# Patient Record
Sex: Female | Born: 1935 | Race: White | Hispanic: No | Marital: Single | Smoking: Never smoker
Health system: Southern US, Community
[De-identification: ages and names within clinical notes are randomized; demographics above are authoritative.]

## PROBLEM LIST (undated history)

## (undated) DIAGNOSIS — R569 Unspecified convulsions: Secondary | ICD-10-CM

## (undated) DIAGNOSIS — G3185 Corticobasal degeneration: Secondary | ICD-10-CM

## (undated) HISTORY — PX: BACK SURGERY: SHX140

## (undated) HISTORY — PX: ABDOMINAL HYSTERECTOMY: SHX81

---

## 2009-02-23 ENCOUNTER — Emergency Department: Payer: Self-pay | Admitting: Unknown Physician Specialty

## 2011-11-12 DIAGNOSIS — G119 Hereditary ataxia, unspecified: Secondary | ICD-10-CM | POA: Insufficient documentation

## 2011-11-12 DIAGNOSIS — G3185 Corticobasal degeneration: Secondary | ICD-10-CM | POA: Insufficient documentation

## 2011-12-16 DIAGNOSIS — Z981 Arthrodesis status: Secondary | ICD-10-CM | POA: Insufficient documentation

## 2012-04-02 DIAGNOSIS — J45909 Unspecified asthma, uncomplicated: Secondary | ICD-10-CM | POA: Insufficient documentation

## 2012-04-02 DIAGNOSIS — C55 Malignant neoplasm of uterus, part unspecified: Secondary | ICD-10-CM | POA: Insufficient documentation

## 2012-04-02 DIAGNOSIS — D649 Anemia, unspecified: Secondary | ICD-10-CM | POA: Insufficient documentation

## 2012-04-02 DIAGNOSIS — IMO0002 Reserved for concepts with insufficient information to code with codable children: Secondary | ICD-10-CM | POA: Insufficient documentation

## 2012-04-02 DIAGNOSIS — E785 Hyperlipidemia, unspecified: Secondary | ICD-10-CM | POA: Insufficient documentation

## 2012-04-02 DIAGNOSIS — R569 Unspecified convulsions: Secondary | ICD-10-CM | POA: Insufficient documentation

## 2012-04-02 DIAGNOSIS — Z79899 Other long term (current) drug therapy: Secondary | ICD-10-CM | POA: Insufficient documentation

## 2012-07-29 DIAGNOSIS — H4010X0 Unspecified open-angle glaucoma, stage unspecified: Secondary | ICD-10-CM | POA: Insufficient documentation

## 2012-08-24 DIAGNOSIS — G4733 Obstructive sleep apnea (adult) (pediatric): Secondary | ICD-10-CM | POA: Insufficient documentation

## 2013-09-16 DIAGNOSIS — K449 Diaphragmatic hernia without obstruction or gangrene: Secondary | ICD-10-CM | POA: Insufficient documentation

## 2015-06-25 ENCOUNTER — Other Ambulatory Visit: Payer: Self-pay | Admitting: Neurology

## 2015-06-25 DIAGNOSIS — G3185 Corticobasal degeneration: Secondary | ICD-10-CM

## 2015-07-03 ENCOUNTER — Ambulatory Visit
Admission: RE | Admit: 2015-07-03 | Discharge: 2015-07-03 | Disposition: A | Discharge status: Home / Self Care | Payer: Medicare Other | Source: Ambulatory Visit | Attending: Neurology | Admitting: Neurology

## 2015-07-03 DIAGNOSIS — G118 Other hereditary ataxias: Secondary | ICD-10-CM | POA: Insufficient documentation

## 2015-07-03 DIAGNOSIS — G3185 Corticobasal degeneration: Secondary | ICD-10-CM

## 2015-07-03 DIAGNOSIS — G249 Dystonia, unspecified: Secondary | ICD-10-CM | POA: Diagnosis present

## 2015-10-30 ENCOUNTER — Emergency Department: Payer: Medicare Other

## 2015-10-30 ENCOUNTER — Encounter: Payer: Self-pay | Admitting: Emergency Medicine

## 2015-10-30 ENCOUNTER — Emergency Department
Admission: EM | Admit: 2015-10-30 | Discharge: 2015-10-30 | Disposition: A | Discharge status: Home / Self Care | Payer: Medicare Other | Attending: Student | Admitting: Student

## 2015-10-30 DIAGNOSIS — S51012A Laceration without foreign body of left elbow, initial encounter: Secondary | ICD-10-CM | POA: Insufficient documentation

## 2015-10-30 DIAGNOSIS — Y9389 Activity, other specified: Secondary | ICD-10-CM | POA: Insufficient documentation

## 2015-10-30 DIAGNOSIS — Y998 Other external cause status: Secondary | ICD-10-CM | POA: Diagnosis not present

## 2015-10-30 DIAGNOSIS — M6248 Contracture of muscle, other site: Secondary | ICD-10-CM | POA: Diagnosis not present

## 2015-10-30 DIAGNOSIS — W01198A Fall on same level from slipping, tripping and stumbling with subsequent striking against other object, initial encounter: Secondary | ICD-10-CM | POA: Diagnosis not present

## 2015-10-30 DIAGNOSIS — Y9289 Other specified places as the place of occurrence of the external cause: Secondary | ICD-10-CM | POA: Insufficient documentation

## 2015-10-30 DIAGNOSIS — Z880 Allergy status to penicillin: Secondary | ICD-10-CM | POA: Diagnosis not present

## 2015-10-30 DIAGNOSIS — W19XXXA Unspecified fall, initial encounter: Secondary | ICD-10-CM

## 2015-10-30 HISTORY — DX: Corticobasal degeneration: G31.85

## 2015-10-30 LAB — GLUCOSE, CAPILLARY: Glucose-Capillary: 86 mg/dL (ref 65–99)

## 2015-10-30 MED ORDER — LIDOCAINE HCL (PF) 1 % IJ SOLN
INTRAMUSCULAR | Status: AC
  Administered 2015-10-30: 15:00:00
  Filled 2015-10-30: qty 5

## 2015-10-30 MED ORDER — SULFAMETHOXAZOLE-TRIMETHOPRIM 800-160 MG PO TABS: 1.0000 | ORAL_TABLET | Freq: Two times a day (BID) | ORAL | Status: DC

## 2015-10-30 NOTE — Discharge Instructions (Signed)
Laceration Care, Adult °A laceration is a cut that goes through all of the layers of the skin and into the tissue that is right under the skin. Some lacerations heal on their own. Others need to be closed with stitches (sutures), staples, skin adhesive strips, or skin glue. Proper laceration care minimizes the risk of infection and helps the laceration to heal better. °HOW TO CARE FOR YOUR LACERATION °If sutures or staples were used: °· Keep the wound clean and dry. °· If you were given a bandage (dressing), you should change it at least one time per day or as told by your health care provider. You should also change it if it becomes wet or dirty. °· Keep the wound completely dry for the first 24 hours or as told by your health care provider. After that time, you may shower or bathe. However, make sure that the wound is not soaked in water until after the sutures or staples have been removed. °· Clean the wound one time each day or as told by your health care provider: °· Wash the wound with soap and water. °· Rinse the wound with water to remove all soap. °· Pat the wound dry with a clean towel. Do not rub the wound. °· After cleaning the wound, apply a thin layer of antibiotic ointment as told by your health care provider. This will help to prevent infection and keep the dressing from sticking to the wound. °· Have the sutures or staples removed as told by your health care provider. °If skin adhesive strips were used: °· Keep the wound clean and dry. °· If you were given a bandage (dressing), you should change it at least one time per day or as told by your health care provider. You should also change it if it becomes dirty or wet. °· Do not get the skin adhesive strips wet. You may shower or bathe, but be careful to keep the wound dry. °· If the wound gets wet, pat it dry with a clean towel. Do not rub the wound. °· Skin adhesive strips fall off on their own. You may trim the strips as the wound heals. Do not  remove skin adhesive strips that are still stuck to the wound. They will fall off in time. °If skin glue was used: °· Try to keep the wound dry, but you may briefly wet it in the shower or bath. Do not soak the wound in water, such as by swimming. °· After you have showered or bathed, gently pat the wound dry with a clean towel. Do not rub the wound. °· Do not do any activities that will make you sweat heavily until the skin glue has fallen off on its own. °· Do not apply liquid, cream, or ointment medicine to the wound while the skin glue is in place. Using those may loosen the film before the wound has healed. °· If you were given a bandage (dressing), you should change it at least one time per day or as told by your health care provider. You should also change it if it becomes dirty or wet. °· If a dressing is placed over the wound, be careful not to apply tape directly over the skin glue. Doing that may cause the glue to be pulled off before the wound has healed. °· Do not pick at the glue. The skin glue usually remains in place for 5-10 days, then it falls off of the skin. °General Instructions °· Take over-the-counter and prescription   medicines only as told by your health care provider. °· If you were prescribed an antibiotic medicine or ointment, take or apply it as told by your doctor. Do not stop using it even if your condition improves. °· To help prevent scarring, make sure to cover your wound with sunscreen whenever you are outside after stitches are removed, after adhesive strips are removed, or when glue remains in place and the wound is healed. Make sure to wear a sunscreen of at least 30 SPF. °· Do not scratch or pick at the wound. °· Keep all follow-up visits as told by your health care provider. This is important. °· Check your wound every day for signs of infection. Watch for: °· Redness, swelling, or pain. °· Fluid, blood, or pus. °· Raise (elevate) the injured area above the level of your heart  while you are sitting or lying down, if possible. °SEEK MEDICAL CARE IF: °· You received a tetanus shot and you have swelling, severe pain, redness, or bleeding at the injection site. °· You have a fever. °· A wound that was closed breaks open. °· You notice a bad smell coming from your wound or your dressing. °· You notice something coming out of the wound, such as wood or glass. °· Your pain is not controlled with medicine. °· You have increased redness, swelling, or pain at the site of your wound. °· You have fluid, blood, or pus coming from your wound. °· You notice a change in the color of your skin near your wound. °· You need to change the dressing frequently due to fluid, blood, or pus draining from the wound. °· You develop a new rash. °· You develop numbness around the wound. °SEEK IMMEDIATE MEDICAL CARE IF: °· You develop severe swelling around the wound. °· Your pain suddenly increases and is severe. °· You develop painful lumps near the wound or on skin that is anywhere on your body. °· You have a red streak going away from your wound. °· The wound is on your hand or foot and you cannot properly move a finger or toe. °· The wound is on your hand or foot and you notice that your fingers or toes look pale or bluish. °  °This information is not intended to replace advice given to you by your health care provider. Make sure you discuss any questions you have with your health care provider. °  °Document Released: 09/05/2005 Document Revised: 01/20/2015 Document Reviewed: 09/01/2014 °Elsevier Interactive Patient Education ©2016 [employer] ° °Stitches, Staples, or Adhesive Wound Closure °Health care providers use stitches (sutures), staples, and certain glue (skin adhesives) to hold skin together while it heals (wound closure). You may need this treatment after you have surgery or if you cut your skin accidentally. These methods help your skin to heal more quickly and make it less likely that you will have  a scar. A wound may take several months to heal completely. °The type of wound you have determines when your wound gets closed. In most cases, the wound is closed as soon as possible (primary skin closure). Sometimes, closure is delayed so the wound can be cleaned and allowed to heal naturally. This reduces the chance of infection. Delayed closure may be needed if your wound: °· Is caused by a bite. °· Happened more than 6 hours ago. °· Involves loss of skin or the tissues under the skin. °· Has dirt or debris in it that cannot be removed. °· Is infected. °WHAT   ARE THE DIFFERENT KINDS OF WOUND CLOSURES? °There are many options for wound closure. The one that your health care provider uses depends on how deep and how large your wound is. °Adhesive Glue °To use this type of glue to close a wound, your health care provider holds the edges of the wound together and paints the glue on the surface of your skin. You may need more than one layer of glue. Then the wound may be covered with a light bandage (dressing). °This type of skin closure may be used for small wounds that are not deep (superficial). Using glue for wound closure is less painful than other methods. It does not require a medicine that numbs the area (local anesthetic). This method also leaves nothing to be removed. Adhesive glue is often used for children and on facial wounds. °Adhesive glue cannot be used for wounds that are deep, uneven, or bleeding. It is not used inside of a wound.  °Adhesive Strips °These strips are made of sticky (adhesive), porous paper. They are applied across your skin edges like a regular adhesive bandage. You leave them on until they fall off. °Adhesive strips may be used to close very superficial wounds. They may also be used along with sutures to improve the closure of your skin edges.  °Sutures °Sutures are the oldest method of wound closure. Sutures can be made from natural substances, such as silk, or from synthetic  materials, such as nylon and steel. They can be made from a material that your body can break down as your wound heals (absorbable), or they can be made from a material that needs to be removed from your skin (nonabsorbable). They come in many different strengths and sizes. °Your health care provider attaches the sutures to a steel needle on one end. Sutures can be passed through your skin, or through the tissues beneath your skin. Then they are tied and cut. Your skin edges may be closed in one continuous stitch or in separate stitches. °Sutures are strong and can be used for all kinds of wounds. Absorbable sutures may be used to close tissues under the skin. The disadvantage of sutures is that they may cause skin reactions that lead to infection. Nonabsorbable sutures need to be removed. °Staples °When surgical staples are used to close a wound, the edges of your skin on both sides of the wound are brought close together. A staple is placed across the wound, and an instrument secures the edges together. Staples are often used to close surgical cuts (incisions). °Staples are faster to use than sutures, and they cause less skin reaction. Staples need to be removed using a tool that bends the staples away from your skin. °HOW DO I CARE FOR MY WOUND CLOSURE? °· Take medicines only as directed by your health care provider. °· If you were prescribed an antibiotic medicine for your wound, finish it all even if you start to feel better. °· Use ointments or creams only as directed by your health care provider. °· Wash your hands with soap and water before and after touching your wound. °· Do not soak your wound in water. Do not take baths, swim, or use a hot tub until your health care provider approves. °· Ask your health care provider when you can start showering. Cover your wound if directed by your health care provider. °· Do not take out your own sutures or staples. °· Do not pick at your wound. Picking can cause an  infection. °·   Keep all follow-up visits as directed by your health care provider. This is important. °HOW LONG WILL I HAVE MY WOUND CLOSURE? °· Leave adhesive glue on your skin until the glue peels away. °· Leave adhesive strips on your skin until the strips fall off. °· Absorbable sutures will dissolve within several days. °· Nonabsorbable sutures and staples must be removed. The location of the wound will determine how long they stay in. This can range from several days to a couple of weeks. °WHEN SHOULD I SEEK HELP FOR MY WOUND CLOSURE? °Contact your health care provider if: °· You have a fever. °· You have chills. °· You have drainage, redness, swelling, or pain at your wound. °· There is a bad smell coming from your wound. °· The skin edges of your wound start to separate after your sutures have been removed. °· Your wound becomes thick, raised, and darker in color after your sutures come out (scarring). °  °This information is not intended to replace advice given to you by your health care provider. Make sure you discuss any questions you have with your health care provider. °  °Document Released: 05/31/2001 Document Revised: 09/26/2014 Document Reviewed: 02/12/2014 °Elsevier Interactive Patient Education ©2016 Elsevier Inc. ° °

## 2015-10-30 NOTE — ED Notes (Addendum)
Patient presents to the ED with a laceration to her left elbow via EMS.  Patient was getting out of her brother's car and fell and hit her elbow.  Patient denies hitting her head and denies any other injury.  Patient is in no obvious distress at this time.  Patient lives at [facility].  Patient has a chronic illness that causes some balance problems.  (When patient's sister arrived she stated that patient hit the left side of her head on a car door.)

## 2015-10-30 NOTE — ED Provider Notes (Signed)
[facility] Emergency Department Provider Note  ____________________________________________  Time seen: Approximately 12:44 PM  I have reviewed the triage vital signs and the nursing notes.   HISTORY  Chief Complaint Extremity Laceration    HPI Debbie Spence is a 81 y.o. female , NAD, presents to the emergency department via EMS due to a fall earlier today. She is accompanied by her sister who assists with history as the patient lives at [facility]. States she was exiting a vehicle lost her balance and fell to her left side. Landed on her left elbow causing a laceration. Her sister does note that she hit the side of her head on a car door but has had no LOC, headache, visual changes. 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 Denies any pain anywhere this time. Sister states patient has had a nerve block on the left upper extremity in the past.    Past Medical History  Diagnosis Date  . Corticobasal syndrome (HCC)     corticobasal gangliar dystonia    There are no active problems to display for this patient.   Past Surgical History  Procedure Laterality Date  . Abdominal hysterectomy    . Back surgery      Current Outpatient Rx  Name  Route  Sig  Dispense  Refill  . sulfamethoxazole-trimethoprim (BACTRIM DS,SEPTRA DS) 800-160 MG tablet   Oral   Take 1 tablet by mouth 2 (two) times daily.   14 tablet   0     Allergies Penicillins  No family history on file.  Social History Social History  Substance Use Topics  . Smoking status: Never Smoker   . Smokeless tobacco: None  . Alcohol Use: No     Review of Systems  Constitutional: No fever/chills Eyes: No visual changes.  Cardiovascular: No chest pain. Respiratory: No shortness of breath. No wheezing.  Gastrointestinal: No abdominal pain.  No nausea, vomiting.   Musculoskeletal: Negative for back, neck, elbow, leg pain.  Skin: Positive  laceration left elbow. Negative for rash, skin sores. Neurological: Negative for headaches, focal weakness or numbness. 10-point ROS otherwise negative.  ____________________________________________   PHYSICAL EXAM:  VITAL SIGNS: ED Triage Vitals  Enc Vitals Group     BP 10/30/15 1230 154/72 mmHg     Pulse Rate 10/30/15 1230 79     Resp 10/30/15 1230 16     Temp 10/30/15 1230 97.5 F (36.4 C)     Temp Source 10/30/15 1230 Oral     SpO2 10/30/15 1230 98 %     Weight 10/30/15 1230 134 lb 14.4 oz (61.19 kg)     Height 10/30/15 1230 5' (1.524 m)     Head Cir --      Peak Flow --      Pain Score 10/30/15 1231 0     Pain Loc --      Pain Edu? --      Excl. in [place]? --     Constitutional: Alert and oriented. Well appearing and in no acute distress. Sister at bedside to assist with history.  Eyes: Conjunctivae are normal. PERRL. EOMI without pain.  Head: Atraumatic. Neck: No cervical spine tenderness to palpation. Chronic contracture to the left Hematological/Lymphatic/Immunilogical: No cervical lymphadenopathy. Cardiovascular: Normal rate, regular rhythm. Normal S1 and S2.  Good peripheral circulation. Respiratory: Normal respiratory effort without tachypnea or retractions. Lungs CTAB. Gastrointestinal: Soft and nontender. No distention.  Musculoskeletal: No lower extremity tenderness nor  edema.  No upper extremity tenderness. No joint effusions. Neurologic:  No gross focal neurologic deficits are appreciated.  Skin:  Deep, 7cm laceration to the left elbow. Olecranon is visible. Skin is warm, dry and intact. No evidence of foreign body. .   ____________________________________________   LABS (all labs ordered are listed, but only abnormal results are displayed)  Labs Reviewed  GLUCOSE, CAPILLARY  CBG MONITORING, ED   ____________________________________________  EKG  None ____________________________________________  RADIOLOGY I have personally viewed and evaluated  these images (plain radiographs) as part of my medical decision making, as well as reviewing the written report by the radiologist.  Dg Elbow Complete Left  10/30/2015  CLINICAL DATA:  Fall onto left elbow on sidewalk. Laceration. Initial encounter. EXAM: LEFT ELBOW - COMPLETE 3+ VIEW COMPARISON:  None. FINDINGS: There is no evidence of acute fracture, dislocation, or elbow joint effusion. Minimal degenerative spurring is noted about the elbow. A punctate soft tissue calcification projects adjacent to the lateral epicondyle. No lytic or blastic osseous lesion or radiopaque foreign body is identified. Soft tissue disruption at the posterior aspect of the elbow is consistent with the provided history of laceration. IMPRESSION: No acute osseous abnormality identified. Electronically Signed   By: Logan Bores M.D.   On: 10/30/2015 13:43    ____________________________________________    PROCEDURES  Procedure(s) performed: LACERATION REPAIR Performed by: Braxton Feathers Authorized by: Braxton Feathers Consent: Verbal consent obtained. Risks and benefits: risks, benefits and alternatives were discussed Consent given by: patient Patient identity confirmed: provided demographic data Prepped and Draped in normal sterile fashion Wound explored  Laceration Location: left elbow  Laceration Length: 7cm  No Foreign Bodies seen or palpated  Anesthesia: local infiltration  Local anesthetic: lidocaine 1% w/o epinephrine  Anesthetic total: 4 ml  Irrigation method: syringe, 126mL saline used to thoroughly irrigate and probe wound.  Amount of cleaning: standard  Skin closure: 4-0 ethylon suture  Number of sutures: 7  Technique:simple, interrupted - minimal spaces left for fluid evacuation during healing  Patient tolerance: Patient tolerated the procedure well with no immediate complications.    Medications  lidocaine (PF) (XYLOCAINE) 1 % injection (  Given by Other 10/30/15 1435)      ____________________________________________   INITIAL IMPRESSION / ASSESSMENT AND PLAN / ED COURSE  Pertinent imaging results that were available during my care of the patient were reviewed by me and considered in my medical decision making (see chart for details).  Patient's diagnosis is consistent with laceration to the left elbow after a fall. Patient will be discharged home with prescriptions for Bactrim DS to take one tablet by mouth twice daily for 7 days to cover empirically for any infection. Patient is to follow up with primary care provider at [facility] within 48 hours for wound check. May have recheck in 7-10 days for suture removal. If any increasing pain, swelling or note any numbness or tingling or onset of fever or chills patient is follow-up with this emergency department for further evaluation and treatment. Patient is given ED precautions to return to the ED for any worsening or new symptoms.    ____________________________________________  FINAL CLINICAL IMPRESSION(S) / ED DIAGNOSES  Final diagnoses:  Laceration of left elbow, initial encounter  Fall, initial encounter      NEW MEDICATIONS STARTED DURING THIS VISIT:  New Prescriptions   SULFAMETHOXAZOLE-TRIMETHOPRIM (BACTRIM DS,SEPTRA DS) 800-160 MG TABLET    Take 1 tablet by mouth 2 (two) times daily.  Braxton Feathers, PA-C 10/30/15 1446  Joanne Gavel, MD 10/30/15 [phone]

## 2016-02-23 ENCOUNTER — Emergency Department: Payer: Medicare Other

## 2016-02-23 ENCOUNTER — Encounter: Payer: Self-pay | Admitting: Emergency Medicine

## 2016-02-23 ENCOUNTER — Emergency Department
Admission: EM | Admit: 2016-02-23 | Discharge: 2016-02-24 | Disposition: A | Discharge status: Home / Self Care | Payer: Medicare Other | Attending: Emergency Medicine | Admitting: Emergency Medicine

## 2016-02-23 DIAGNOSIS — Z8669 Personal history of other diseases of the nervous system and sense organs: Secondary | ICD-10-CM | POA: Diagnosis not present

## 2016-02-23 DIAGNOSIS — R079 Chest pain, unspecified: Secondary | ICD-10-CM | POA: Diagnosis not present

## 2016-02-23 HISTORY — DX: Unspecified convulsions: R56.9

## 2016-02-23 LAB — CBC
HCT: 36.9 % (ref 35.0–47.0)
Hemoglobin: 12.3 g/dL (ref 12.0–16.0)
MCH: 31.4 pg (ref 26.0–34.0)
MCHC: 33.3 g/dL (ref 32.0–36.0)
MCV: 94.2 fL (ref 80.0–100.0)
PLATELETS: 241 10*3/uL (ref 150–440)
RBC: 3.92 MIL/uL (ref 3.80–5.20)
RDW: 13.4 % (ref 11.5–14.5)
WBC: 6.6 10*3/uL (ref 3.6–11.0)

## 2016-02-23 LAB — TROPONIN I: Troponin I: <0.03 ng/mL (ref <0.031)

## 2016-02-23 LAB — BASIC METABOLIC PANEL
Anion gap: 10 (ref 5–15)
BUN: 15 mg/dL (ref 6–20)
CALCIUM: 9.4 mg/dL (ref 8.9–10.3)
CO2: 28 mmol/L (ref 22–32)
CREATININE: 0.43 mg/dL — AB (ref 0.44–1.00)
Chloride: 96 mmol/L — ABNORMAL LOW (ref 101–111)
GFR calc Af Amer: >60 mL/min (ref >60)
GLUCOSE: 119 mg/dL — AB (ref 65–99)
Potassium: 4 mmol/L (ref 3.5–5.1)
Sodium: 134 mmol/L — ABNORMAL LOW (ref 135–145)

## 2016-02-23 NOTE — ED Notes (Signed)
Pt denies chest pain at when leaving the ED. Pt discharged by [place] RN

## 2016-02-23 NOTE — ED Provider Notes (Signed)
Patient's second troponin is negative. Patient has no further chest pain. Blood pressure somewhat elevated at 0000000 systolic. I will have her follow up with cardiology tomorrow.  Nena Polio, MD 02/23/16 2236

## 2016-02-23 NOTE — ED Provider Notes (Signed)
[facility] Emergency Department Provider Note  ____________________________________________    I have reviewed the triage vital signs and the nursing notes.   HISTORY  Chief Complaint Chest Pain    HPI Debbie Spence is a 81 y.o. female who presents with complaints of chest pain. Patient reports right-sided chest pain nonradiating which started this morning. She reports the pain has been somewhat intermittent. She feels better now, apparently receives nitroglycerin glycerin via EMS. No history of heart attack. No shortness of breath. Patient is ambulatory with a walker     Past Medical History  Diagnosis Date  . Corticobasal syndrome (HCC)     corticobasal gangliar dystonia  . Seizures ([place])     There are no active problems to display for this patient.   Past Surgical History  Procedure Laterality Date  . Abdominal hysterectomy    . Back surgery      Current Outpatient Rx  Name  Route  Sig  Dispense  Refill  . sulfamethoxazole-trimethoprim (BACTRIM DS,SEPTRA DS) 800-160 MG tablet   Oral   Take 1 tablet by mouth 2 (two) times daily.   14 tablet   0     Allergies Penicillins  No family history on file.  Social History Social History  Substance Use Topics  . Smoking status: Never Smoker   . Smokeless tobacco: None  . Alcohol Use: No    Review of Systems  Constitutional: Negative for fever. Eyes: Negative for redness ENT: Negative for sore throat Cardiovascular: As above Respiratory: Negative for shortness of breath. Gastrointestinal: Negative for abdominal pain Genitourinary: Negative for dysuria. Musculoskeletal: Negative for back pain. Skin: Negative for rash. Neurological: Negative for focal weakness Psychiatric: no anxiety    ____________________________________________   PHYSICAL EXAM:  VITAL SIGNS: ED Triage Vitals  Enc Vitals Group     BP 02/23/16 1925 153/96 mmHg     Pulse Rate 02/23/16 1925 88     Resp  02/23/16 1925 20     Temp 02/23/16 1925 97.7 F (36.5 C)     Temp Source 02/23/16 1925 Oral     SpO2 02/23/16 1925 96 %     Weight --      Height --      Head Cir --      Peak Flow --      Pain Score 02/23/16 1918 0     Pain Loc --      Pain Edu? --      Excl. in [place]? --     Constitutional: Alert and oriented. No acute distress Eyes: Conjunctivae are normal. No erythema or injection ENT   Head: Normocephalic and atraumatic.   Mouth/Throat: Mucous membranes are moist. Cardiovascular: Normal rate, regular rhythm. Normal and symmetric distal pulses are present in the upper extremities.  Respiratory: Normal respiratory effort without tachypnea nor retractions. Breath sounds are clear and equal bilaterally.  Gastrointestinal: Soft and non-tender in all quadrants. No distention. There is no CVA tenderness. Genitourinary: deferred Musculoskeletal No lower extremity tenderness nor edema. Neurologic:  Normal speech and language.  Skin:  Skin is warm, dry and intact. No rash noted. Psychiatric: Mood and affect are normal. Patient exhibits appropriate insight and judgment.  ____________________________________________    LABS (pertinent positives/negatives)  Labs Reviewed  CBC  TROPONIN I  BASIC METABOLIC PANEL    ____________________________________________   EKG  ED ECG REPORT I, Lavonia Drafts, the attending physician, personally viewed and interpreted this ECG.  Date: 02/23/2016 EKG Time: 7:20 PM  Rate: 92 Rhythm: normal sinus rhythm QRS Axis: normal Intervals: normal ST/T Wave abnormalities: normal Conduction Disturbances: none Narrative Interpretation: unremarkable   ____________________________________________    RADIOLOGY  Chest x-ray unremarkable  ____________________________________________   PROCEDURES  Procedure(s) performed: none  Critical Care performed: none  ____________________________________________   INITIAL IMPRESSION /  ASSESSMENT AND PLAN / ED COURSE  Pertinent labs & imaging results that were available during my care of the patient were reviewed by me and considered in my medical decision making (see chart for details).  Patient presents with right-sided chest pain, EKG is unremarkable, we will check labs  ----------------------------------------- 9:27 PM on 02/23/2016 -----------------------------------------  Initial troponin is normal. We will recheck. Patient remains chest pain-free. If two normal troponins and patient remains chest pain-free I feel she will be appropriate for discharge. I have asked my colleague to follow up on the second troponin  ____________________________________________   FINAL CLINICAL IMPRESSION(S) / ED DIAGNOSES  Chest pain       Lavonia Drafts, MD 02/23/16 2127

## 2016-02-23 NOTE — ED Notes (Addendum)
Pt from [facility] via EMS for RT side CP, non radiating. Per EMS facility gave 2nitro before EMS arrived.  Per facility pt has been hypertensive , EMS BP 162/98, all VS stable. Pt A&O.

## 2016-02-23 NOTE — ED Notes (Signed)
Lab called for hemolyzed spec, see orders, MD notified

## 2016-03-03 DIAGNOSIS — I10 Essential (primary) hypertension: Secondary | ICD-10-CM | POA: Insufficient documentation

## 2016-03-03 DIAGNOSIS — R0789 Other chest pain: Secondary | ICD-10-CM | POA: Insufficient documentation

## 2016-05-16 ENCOUNTER — Other Ambulatory Visit: Payer: Self-pay | Admitting: Neurology

## 2016-05-16 DIAGNOSIS — R200 Anesthesia of skin: Secondary | ICD-10-CM

## 2016-05-27 ENCOUNTER — Ambulatory Visit
Admission: RE | Admit: 2016-05-27 | Discharge: 2016-05-27 | Disposition: A | Discharge status: Home / Self Care | Payer: Medicare Other | Source: Ambulatory Visit | Attending: Neurology | Admitting: Neurology

## 2016-05-27 DIAGNOSIS — R200 Anesthesia of skin: Secondary | ICD-10-CM | POA: Diagnosis present

## 2016-08-20 DIAGNOSIS — R482 Apraxia: Secondary | ICD-10-CM | POA: Insufficient documentation

## 2016-11-10 DIAGNOSIS — J4520 Mild intermittent asthma, uncomplicated: Secondary | ICD-10-CM | POA: Insufficient documentation

## 2016-11-10 DIAGNOSIS — G629 Polyneuropathy, unspecified: Secondary | ICD-10-CM | POA: Insufficient documentation

## 2016-11-10 DIAGNOSIS — M1991 Primary osteoarthritis, unspecified site: Secondary | ICD-10-CM | POA: Insufficient documentation

## 2017-01-05 ENCOUNTER — Encounter: Payer: Self-pay | Admitting: Podiatry

## 2017-01-05 ENCOUNTER — Ambulatory Visit (INDEPENDENT_AMBULATORY_CARE_PROVIDER_SITE_OTHER): Payer: Medicare Other | Admitting: Podiatry

## 2017-01-05 VITALS — BP 145/78 | HR 58

## 2017-01-05 DIAGNOSIS — M79676 Pain in unspecified toe(s): Secondary | ICD-10-CM

## 2017-01-05 DIAGNOSIS — B351 Tinea unguium: Secondary | ICD-10-CM

## 2017-01-05 NOTE — Progress Notes (Signed)
   Subjective:    Patient ID: Debbie Spence, female    DOB: 21-Mar-1935, 82 y.o.   MRN: [id number]  HPI this patient presents the office with chief complaint of long thick nails. Nails are painful wearing her shoes. She has not had her nails done in months. She presents the office today for preventative foot care services. No history of diabetes noted    Review of Systems  Musculoskeletal:       Muscle pain  Neurological: Positive for seizures.       Objective:   Physical Exam GENERAL APPEARANCE: Alert, conversant. Appropriately groomed. No acute distress.  VASCULAR: Pedal pulses are  palpable at  [facility] and PT bilateral.  Capillary refill time is immediate to all digits,  Normal temperature gradient.   NEUROLOGIC: sensation is normal to 5.07 monofilament at 5/5 sites bilateral.  Light touch is intact bilateral, Muscle strength normal.  MUSCULOSKELETAL: acceptable muscle strength, tone and stability bilateral.  Intrinsic muscluature intact bilateral.  Rectus appearance of foot and digits noted bilateral.   DERMATOLOGIC: skin color, texture, and turgor are within normal limits.  No preulcerative lesions or ulcers  are seen, no interdigital maceration noted.  No open lesions present.  . No drainage noted.  NAILS  thick disfigured discolored nails with subungual debris noted. No evidence of any redness or infection.         Assessment & Plan:  Onychomycosis  B/L  IE  Debridement of nails  RTC 4 months.   Gardiner Barefoot DPM

## 2017-02-11 ENCOUNTER — Emergency Department: Payer: Medicare Other

## 2017-02-11 ENCOUNTER — Emergency Department
Admission: EM | Admit: 2017-02-11 | Discharge: 2017-02-11 | Disposition: A | Discharge status: Home / Self Care | Payer: Medicare Other | Attending: Emergency Medicine | Admitting: Emergency Medicine

## 2017-02-11 DIAGNOSIS — Y999 Unspecified external cause status: Secondary | ICD-10-CM | POA: Diagnosis not present

## 2017-02-11 DIAGNOSIS — M549 Dorsalgia, unspecified: Secondary | ICD-10-CM | POA: Diagnosis not present

## 2017-02-11 DIAGNOSIS — W19XXXA Unspecified fall, initial encounter: Secondary | ICD-10-CM | POA: Diagnosis not present

## 2017-02-11 DIAGNOSIS — Y939 Activity, unspecified: Secondary | ICD-10-CM | POA: Diagnosis not present

## 2017-02-11 DIAGNOSIS — M25512 Pain in left shoulder: Secondary | ICD-10-CM | POA: Insufficient documentation

## 2017-02-11 DIAGNOSIS — R55 Syncope and collapse: Secondary | ICD-10-CM | POA: Diagnosis not present

## 2017-02-11 DIAGNOSIS — Y929 Unspecified place or not applicable: Secondary | ICD-10-CM | POA: Insufficient documentation

## 2017-02-11 DIAGNOSIS — S4992XA Unspecified injury of left shoulder and upper arm, initial encounter: Secondary | ICD-10-CM | POA: Diagnosis present

## 2017-02-11 LAB — COMPREHENSIVE METABOLIC PANEL
ALT: 31 U/L (ref 14–54)
AST: 29 U/L (ref 15–41)
Albumin: 4.2 g/dL (ref 3.5–5.0)
Alkaline Phosphatase: 77 U/L (ref 38–126)
Anion gap: 7 (ref 5–15)
BILIRUBIN TOTAL: 0.5 mg/dL (ref 0.3–1.2)
BUN: 20 mg/dL (ref 6–20)
CALCIUM: 9.3 mg/dL (ref 8.9–10.3)
CO2: 30 mmol/L (ref 22–32)
Chloride: 101 mmol/L (ref 101–111)
Creatinine, Ser: 0.48 mg/dL (ref 0.44–1.00)
GFR calc non Af Amer: >60 mL/min (ref >60)
GLUCOSE: 112 mg/dL — AB (ref 65–99)
Potassium: 4.5 mmol/L (ref 3.5–5.1)
Sodium: 138 mmol/L (ref 135–145)
TOTAL PROTEIN: 7 g/dL (ref 6.5–8.1)

## 2017-02-11 LAB — CBC WITH DIFFERENTIAL/PLATELET
BASOS ABS: 0 10*3/uL (ref 0–0.1)
BASOS PCT: 0 %
EOS ABS: 0 10*3/uL (ref 0–0.7)
EOS PCT: 0 %
HCT: 39.6 % (ref 35.0–47.0)
Hemoglobin: 12.9 g/dL (ref 12.0–16.0)
LYMPHS PCT: 11 %
Lymphs Abs: 1.2 10*3/uL (ref 1.0–3.6)
MCH: 30.1 pg (ref 26.0–34.0)
MCHC: 32.6 g/dL (ref 32.0–36.0)
MCV: 92.3 fL (ref 80.0–100.0)
Monocytes Absolute: 0.4 10*3/uL (ref 0.2–0.9)
Monocytes Relative: 3 %
Neutro Abs: 9.3 10*3/uL — ABNORMAL HIGH (ref 1.4–6.5)
Neutrophils Relative %: 86 %
PLATELETS: 308 10*3/uL (ref 150–440)
RBC: 4.29 MIL/uL (ref 3.80–5.20)
RDW: 17 % — ABNORMAL HIGH (ref 11.5–14.5)
WBC: 10.9 10*3/uL (ref 3.6–11.0)

## 2017-02-11 LAB — URINALYSIS, COMPLETE (UACMP) WITH MICROSCOPIC
BILIRUBIN URINE: NEGATIVE
Bacteria, UA: NONE SEEN
Glucose, UA: NEGATIVE mg/dL
HGB URINE DIPSTICK: NEGATIVE
Ketones, ur: NEGATIVE mg/dL
LEUKOCYTES UA: NEGATIVE
NITRITE: NEGATIVE
PH: 6 (ref 5.0–8.0)
Protein, ur: NEGATIVE mg/dL
Specific Gravity, Urine: 1.019 (ref 1.005–1.030)
Squamous Epithelial / LPF: NONE SEEN

## 2017-02-11 LAB — CK: Total CK: 263 U/L — ABNORMAL HIGH (ref 38–234)

## 2017-02-11 LAB — TROPONIN I: Troponin I: <0.03 ng/mL (ref <0.03)

## 2017-02-11 NOTE — ED Triage Notes (Signed)
Pt arrived via EMS from [facility]. She had an unwitnessed fall around 3:00am while trying to get up to use the bathroom. While on the floor she couldn't reach the call bell for help and was found on floor this morning.  Pt states having no pain. Pt shows Left elbow abrasion and Left hand swelling. Pt has deformity from old wound on right hand. EMS said that patient is able to ambulate with assistance. Pt is alert and oriented. EMS VS were BP-106/50 HR-62 O2-94% Pt. Has knots and red spots in her head. Pt also has what looks like old bruising on both arms.

## 2017-02-11 NOTE — Discharge Instructions (Addendum)
Keep the cut on your hand clean and dry. Have your doctor keep an eye on it. Return for any signs of infection or more pain or redness.

## 2017-02-11 NOTE — ED Notes (Signed)
Patient denies pain and is resting comfortably.  

## 2017-02-11 NOTE — ED Notes (Signed)
Patient transported to CT and XR 

## 2017-02-11 NOTE — ED Notes (Addendum)
Spoke with family member, Mickel Baas [phone], who will provide transportation back to [facility] along with her brother. Family will not be here for about 30-45 minutes. Patient resting quietly at this time, respirations equal and unlabored. Patient notified of the plan.

## 2017-02-11 NOTE — ED Notes (Signed)
EDP in room, pt frequently pressing call button stating that she thinks she is falling out of bed and that her BP is high.  Pt c/o some back pain. Pt has been repositioned multiple times and offered blankets and toileting.  Offered to move to the hallway where she can be watched very closely and patient agreeable.

## 2017-02-11 NOTE — ED Notes (Signed)
Report called to [facility] 

## 2017-02-11 NOTE — ED Provider Notes (Addendum)
[facility] Emergency Department Provider Note   ____________________________________________   First MD Initiated Contact with Patient 02/11/17 [id number]     (approximate)  I have reviewed the triage vital signs and the nursing notes.   HISTORY  Chief Complaint Fall    HPI Debbie Spence is a 82 y.o. female who said she fell about 3:00 in the morning while trying to go to the bathroom. She could not reach the call bell and suspected not on the floor. Now she complains of pain in her left shoulder and her back. She says nothing else hurts her. Pain seems to be mild. She is sitting up without difficulty and moves her shoulder without difficulty to. She does not appear to be in any distress.  Past Medical History:  Diagnosis Date  . Corticobasal syndrome    corticobasal gangliar dystonia  . Seizures ([place])     There are no active problems to display for this patient.   Past Surgical History:  Procedure Laterality Date  . ABDOMINAL HYSTERECTOMY    . BACK SURGERY      Prior to Admission medications   Medication Sig Start Date End Date Taking? Authorizing Provider  carbidopa-levodopa (SINEMET IR) 25-250 MG tablet  01/04/17   [provider]  chlorhexidine (PERIDEX) 0.12 % solution  12/15/16   [provider]  fluticasone Asencion Islam) 50 MCG/ACT nasal spray  12/04/16   [provider]  levETIRAcetam (KEPPRA) 100 MG/ML solution  01/03/17   [provider]  metoprolol tartrate (LOPRESSOR) 25 MG tablet  12/20/16   [provider]  PHENObarbital (LUMINAL) 15 MG tablet  12/19/16   [provider]  simvastatin (ZOCOR) 20 MG tablet  12/20/16   [provider]  sulfamethoxazole-trimethoprim (BACTRIM DS,SEPTRA DS) 800-160 MG tablet Take 1 tablet by mouth 2 (two) times daily. 10/30/15   Hagler, Jami L, PA-C    Allergies Neurontin [gabapentin]; Penicillins; and Seasonal ic [cholestatin]  History reviewed. No  pertinent family history.  Social History Social History  Substance Use Topics  . Smoking status: Never Smoker  . Smokeless tobacco: Never Used  . Alcohol use No    Review of Systems  Constitutional: No fever/chills Eyes: No visual changes. ENT: No sore throat. Cardiovascular: Denies chest pain. Respiratory: Denies shortness of breath. Gastrointestinal: No abdominal pain.  No nausea, no vomiting.  No diarrhea.  No constipation. Genitourinary: Negative for dysuria. Musculoskeletal: Negative for back pain. Skin: Negative for rash. Neurological: Negative for headaches,Or new focal weakness or numbness.   ____________________________________________   PHYSICAL EXAM:  VITAL SIGNS: ED Triage Vitals  Enc Vitals Group     BP 02/11/17 0633 (!) 164/74     Pulse Rate 02/11/17 0633 82     Resp 02/11/17 0633 18     Temp 02/11/17 0633 (!) 96.8 F (36 C)     Temp Source 02/11/17 0633 Axillary     SpO2 02/11/17 0633 99 %     Weight 02/11/17 0635 134 lb (60.8 kg)     Height 02/11/17 0635 5' (1.524 m)     Head Circumference --      Peak Flow --      Pain Score 02/11/17 0715 0     Pain Loc --      Pain Edu? --      Excl. in [place]? --     Constitutional: Alert  Well appearing and in no acute distress. Eyes: Conjunctivae are normal.  Head: Patient has some small  bruises around the left side of the head Nose: No congestion/rhinnorhea. Mouth/Throat: Mucous membranes are moist.  . Neck: No stridor. No cervical spine tenderness to palpation. Cardiovascular: Normal rate, regular rhythm. Grossly normal heart sounds.  Good peripheral circulation. Respiratory: Normal respiratory effort.  No retractions. Lungs CTAB. Gastrointestinal: Soft and nontender. No distention. No abdominal bruits. No CVA tenderness. Musculoskeletal: No lower extremity tenderness nor edema.  No joint effusions back is not tender to palpation either over the ribs. Patient has swelling of the left hand with what appears  to be bruising and a skin tear on the top of the hand skin tear does not appear to be infected. There is also bruising on the palmar surface of the left forearm. He is not tender to palpation. Do not really see any tenderness to palpation of the shoulder either. Neurologic:  Normal speech and language. She has contractures of both hands Skin:  Skin is warm, dry and intact. No rash noted. Psychiatric: Mood and affect are normal. Speech and behavior are normal.  ____________________________________________   LABS (all labs ordered are listed, but only abnormal results are displayed)  Labs Reviewed  CBC WITH DIFFERENTIAL/PLATELET - Abnormal; Notable for the following:       Result Value   RDW 17.0 (*)    Neutro Abs 9.3 (*)    All other components within normal limits  URINALYSIS, COMPLETE (UACMP) WITH MICROSCOPIC - Abnormal; Notable for the following:    Color, Urine YELLOW (*)    APPearance CLEAR (*)    All other components within normal limits  CK - Abnormal; Notable for the following:    Total CK 263 (*)    All other components within normal limits  COMPREHENSIVE METABOLIC PANEL - Abnormal; Notable for the following:    Glucose, Bld 112 (*)    All other components within normal limits  TROPONIN I   ____________________________________________  EKG  EKG read and interpreted by me shows normal sinus rhythm rate of 78 normal axis there are flipped T waves in the lateral chest leads which are new from June of last year. Patient does not have any chest pain or shortness of breath. We'll check a troponin see what that looks like. ____________________________________________  RADIOLOGY  Addendum   ADDENDUM REPORT: 02/11/2017 08:46  ADDENDUM: The report incorrectly states the patient is status post cholecystectomy. The gallbladder is visualized and appears normal. No cholelithiasis is noted.   Electronically Signed   By: Marijo Conception, M.D.   On: 02/11/2017 08:46     Addended by Sabino Dick, MD on 02/11/2017 8:49 AM    Study Result   CLINICAL DATA:  Unwitnessed fall.  Uncertain loss of consciousness.  EXAM: CT HEAD WITHOUT CONTRAST  CT CERVICAL SPINE WITHOUT CONTRAST  TECHNIQUE: Multidetector CT imaging of the head and cervical spine was performed following the standard protocol without intravenous contrast. Multiplanar CT image reconstructions of the cervical spine were also generated.  COMPARISON:  None.  FINDINGS: CT HEAD FINDINGS  Brain: Mild chronic ischemic white matter disease is noted. No mass effect or midline shift is noted. Ventricular size is within normal limits. There is no evidence of mass lesion, hemorrhage or acute infarction.  Vascular: No hyperdense vessel or unexpected calcification.  Skull: Normal. Negative for fracture or focal lesion.  Sinuses/Orbits: Left maxillary sinusitis is noted.  Other: None.  CT CERVICAL SPINE FINDINGS  Alignment: Grade 1 anterolisthesis of C4-5 is noted secondary to posterior facet joint hypertrophy.  Skull base  and vertebrae: No acute fracture. No primary bone lesion or focal pathologic process.  Soft tissues and spinal canal: No prevertebral fluid or swelling. No visible canal hematoma.  Disc levels: Severe degenerative disc disease is noted at C5-6, C6-7 and C7-T1 with anterior osteophyte formation.  Upper chest: Negative.  Other: None.  IMPRESSION: Mild chronic ischemic white matter disease. No acute intracranial abnormality seen.  Severe multilevel degenerative disc disease. No acute abnormality seen in the cervical spine.  Electronically Signed: By: Marijo Conception, M.D. On: 02/11/2017 08:26     EXAM: LEFT HAND - COMPLETE 3+ VIEW  COMPARISON:  None.  FINDINGS: Significant deformity at the fifth proximal interphalangeal joint with significant subluxation and bony erosive changes. These findings may be largely chronic. Severe  degenerative changes throughout the second and third digits with subluxation at the distal interphalangeal joints. Subluxation of the base of the first metacarpal versus the trapezium with degenerative changes. No definitive acute fractures.  IMPRESSION: 1. The findings in the hand are likely primarily chronic. Subluxation at the fifth proximal interphalangeal joint, the distal second and third interphalangeal joints, and at the base of the first metacarpal may all be chronic. There are erosive changes in the adjacent bone at the fifth proximal interphalangeal joint which is chronic. No definitive acute fracture.   Electronically Signed   By: Dorise Bullion III M.D   On: 02/11/2017 08:37  _____Study Result   CLINICAL DATA:  Pain after trauma  EXAM: LEFT SHOULDER - 2+ VIEW  COMPARISON:  None.  FINDINGS: Postoperative changes to the left shoulder with a plate affixed to the scapula and humerus by multiple screws. Surgical hardware is intact. There appears to be fusion at the glenohumeral joint. Healed left-sided rib fractures. No acute fracture identified.  IMPRESSION: Chronic changes in the left shoulder. There appears to be fusion at the glenohumeral joint. No acute fracture identified.   Electronically Signed   By: Dorise Bullion III M.D   On: 02/11/2017 08:38   _______________________________________   PROCEDURES  Procedure(s) performed:  Procedures  Critical Care performed:   ____________________________________________   INITIAL IMPRESSION / ASSESSMENT AND PLAN / ED COURSE  Pertinent labs & imaging results that were available during my care of the patient were reviewed by me and considered in my medical decision making (see chart for details).     patient doing well on discharge appears stable no signs of infection in the hand no apparent new fractures and really isn't that tenderTroponin is negative. Patient does say her hand has been  the weight is which is somewhat deformed chronically. There is no increased pain since before her fall that gave her the skin tear several days ago. In other words the fall did not affect the pain that she chronically has in her hand.   ____________________________________________   FINAL CLINICAL IMPRESSION(S) / ED DIAGNOSES  Final diagnoses:  Fall, initial encounter      NEW MEDICATIONS STARTED DURING THIS VISIT:  New Prescriptions   No medications on file     Note:  This document was prepared using Dragon voice recognition software and may include unintentional dictation errors.    Nena Polio, MD 02/11/17 1032    Nena Polio, MD 02/11/17 1033    Nena Polio, MD 02/11/17 1054

## 2017-05-08 ENCOUNTER — Ambulatory Visit (INDEPENDENT_AMBULATORY_CARE_PROVIDER_SITE_OTHER): Payer: Medicare Other | Admitting: Podiatry

## 2017-05-08 DIAGNOSIS — B351 Tinea unguium: Secondary | ICD-10-CM | POA: Diagnosis not present

## 2017-05-08 DIAGNOSIS — M79676 Pain in unspecified toe(s): Secondary | ICD-10-CM

## 2017-05-08 NOTE — Progress Notes (Signed)
Complaint:  Visit Type: Patient returns to my office for continued preventative foot care services. Complaint: Patient states" my nails have grown long and thick and become painful to walk and wear shoes" . The patient presents for preventative foot care services. No changes to ROS  Podiatric Exam: Vascular: dorsalis pedis and posterior tibial pulses are palpable bilateral. Capillary return is immediate. Temperature gradient is WNL. Skin turgor WNL  Sensorium: Normal Semmes Weinstein monofilament test. Normal tactile sensation bilaterally. Nail Exam: Pt has thick disfigured discolored nails with subungual debris noted bilateral entire nail hallux through fifth toenails Ulcer Exam: There is no evidence of ulcer or pre-ulcerative changes or infection. Orthopedic Exam: Muscle tone and strength are WNL. No limitations in general ROM. No crepitus or effusions noted. Foot type and digits show no abnormalities. Bony prominences are unremarkable. Skin: No Porokeratosis. No infection or ulcers  Diagnosis:  Onychomycosis, , Pain in right toe, pain in left toes  Treatment & Plan Procedures and Treatment: Consent by patient was obtained for treatment procedures. The patient understood the discussion of treatment and procedures well. All questions were answered thoroughly reviewed. Debridement of mycotic and hypertrophic toenails, 1 through 5 bilateral and clearing of subungual debris. No ulceration, no infection noted.  Return Visit-Office Procedure: Patient instructed to return to the office for a follow up visit 4 months   for continued evaluation and treatment.    Massimiliano Rohleder DPM 

## 2017-05-25 DIAGNOSIS — M25511 Pain in right shoulder: Secondary | ICD-10-CM | POA: Insufficient documentation

## 2017-08-14 ENCOUNTER — Ambulatory Visit (INDEPENDENT_AMBULATORY_CARE_PROVIDER_SITE_OTHER): Payer: Medicare Other | Admitting: Podiatry

## 2017-08-14 ENCOUNTER — Encounter: Payer: Self-pay | Admitting: Podiatry

## 2017-08-14 DIAGNOSIS — G629 Polyneuropathy, unspecified: Secondary | ICD-10-CM

## 2017-08-14 DIAGNOSIS — B351 Tinea unguium: Secondary | ICD-10-CM

## 2017-08-14 DIAGNOSIS — M79676 Pain in unspecified toe(s): Secondary | ICD-10-CM | POA: Diagnosis not present

## 2017-08-14 NOTE — Progress Notes (Signed)
Complaint:  Visit Type: Patient returns to my office for continued preventative foot care services. Complaint: Patient states" my nails have grown long and thick and become painful to walk and wear shoes" . The patient presents for preventative foot care services. No changes to ROS  Podiatric Exam: Vascular: dorsalis pedis and posterior tibial pulses are palpable bilateral. Capillary return is immediate. Temperature gradient is WNL. Skin turgor WNL  Sensorium: Normal Semmes Weinstein monofilament test. Normal tactile sensation bilaterally. Nail Exam: Pt has thick disfigured discolored nails with subungual debris noted bilateral entire nail hallux through fifth toenails Ulcer Exam: There is no evidence of ulcer or pre-ulcerative changes or infection. Orthopedic Exam: Muscle tone and strength are WNL. No limitations in general ROM. No crepitus or effusions noted. Foot type and digits show no abnormalities. Bony prominences are unremarkable. Skin: No Porokeratosis. No infection or ulcers  Diagnosis:  Onychomycosis, , Pain in right toe, pain in left toes  Treatment & Plan Procedures and Treatment: Consent by patient was obtained for treatment procedures. The patient understood the discussion of treatment and procedures well. All questions were answered thoroughly reviewed. Debridement of mycotic and hypertrophic toenails, 1 through 5 bilateral and clearing of subungual debris. No ulceration, no infection noted.  Return Visit-Office Procedure: Patient instructed to return to the office for a follow up visit 3 months   for continued evaluation and treatment.    Orenthal Debski DPM 

## 2017-10-20 DEATH — deceased

## 2017-11-16 ENCOUNTER — Ambulatory Visit: Payer: Medicare Other | Admitting: Podiatry

## 2018-03-25 IMAGING — CR DG SHOULDER 2+V*L*
3 series · 3 of 3 positions shown · non-contrast
Comparison: None.

CLINICAL DATA: Pain after trauma

EXAM:
LEFT SHOULDER - 2+ VIEW

[shoulder grashey]
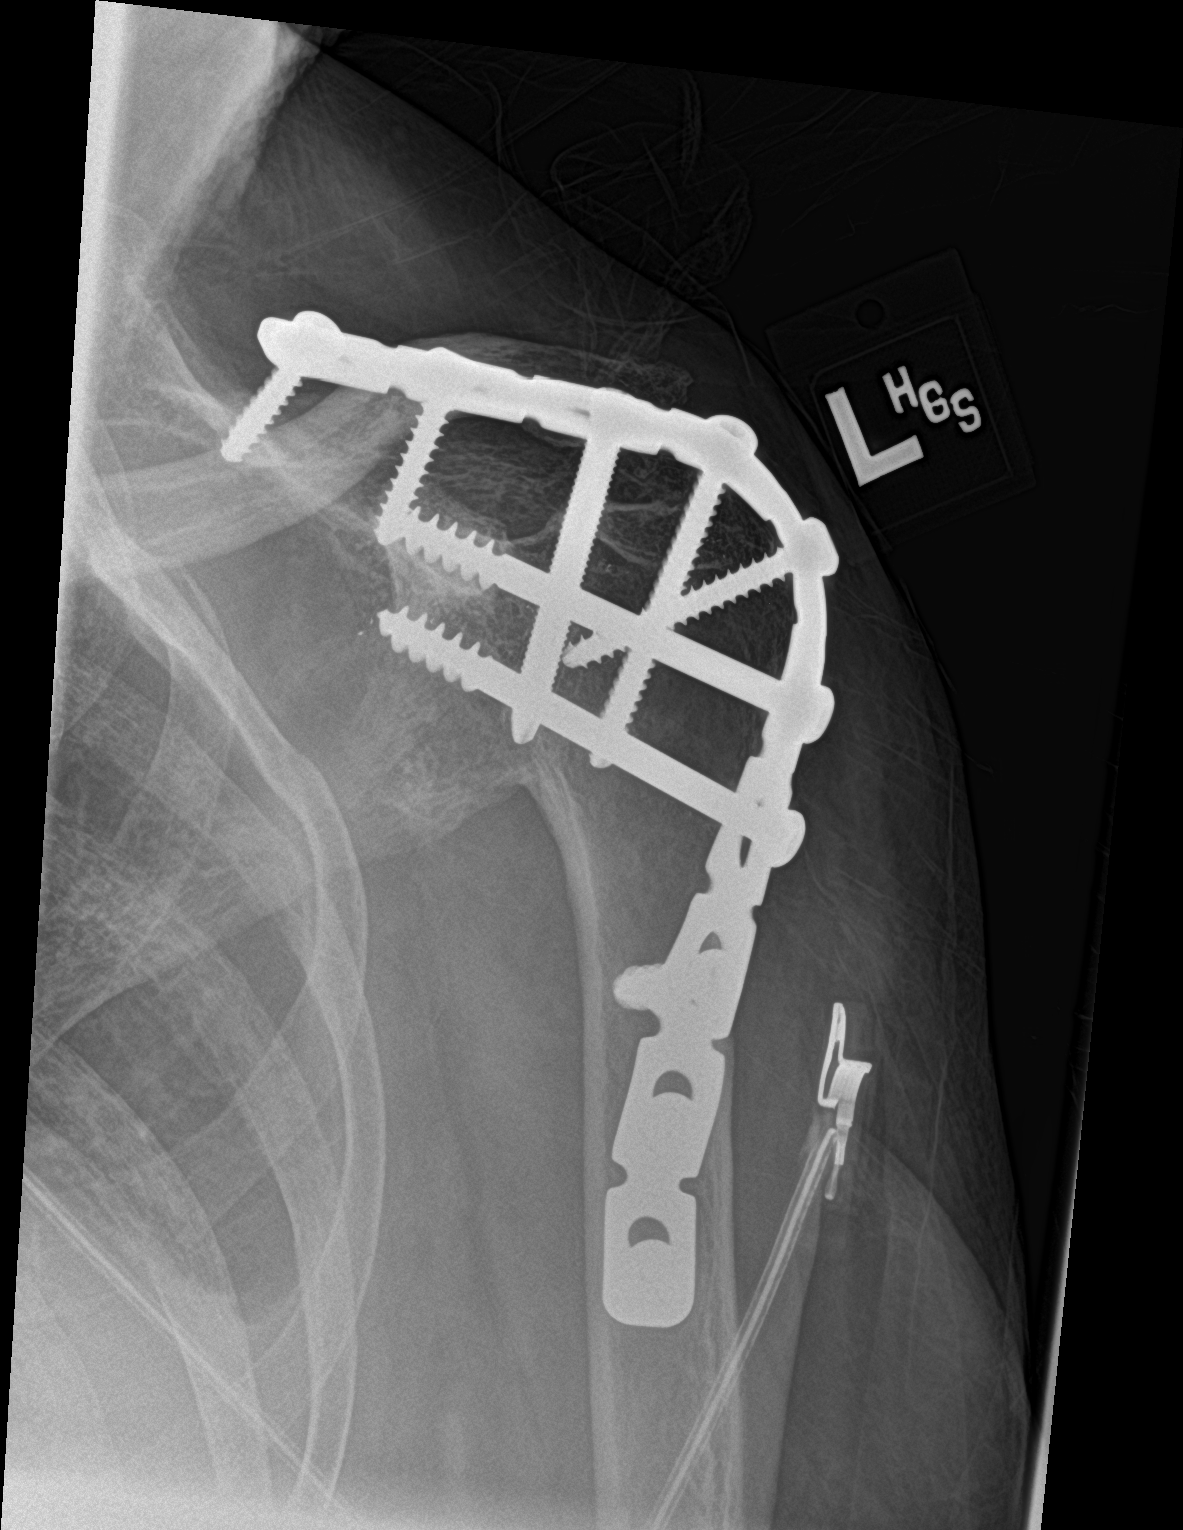

[shoulder y view]
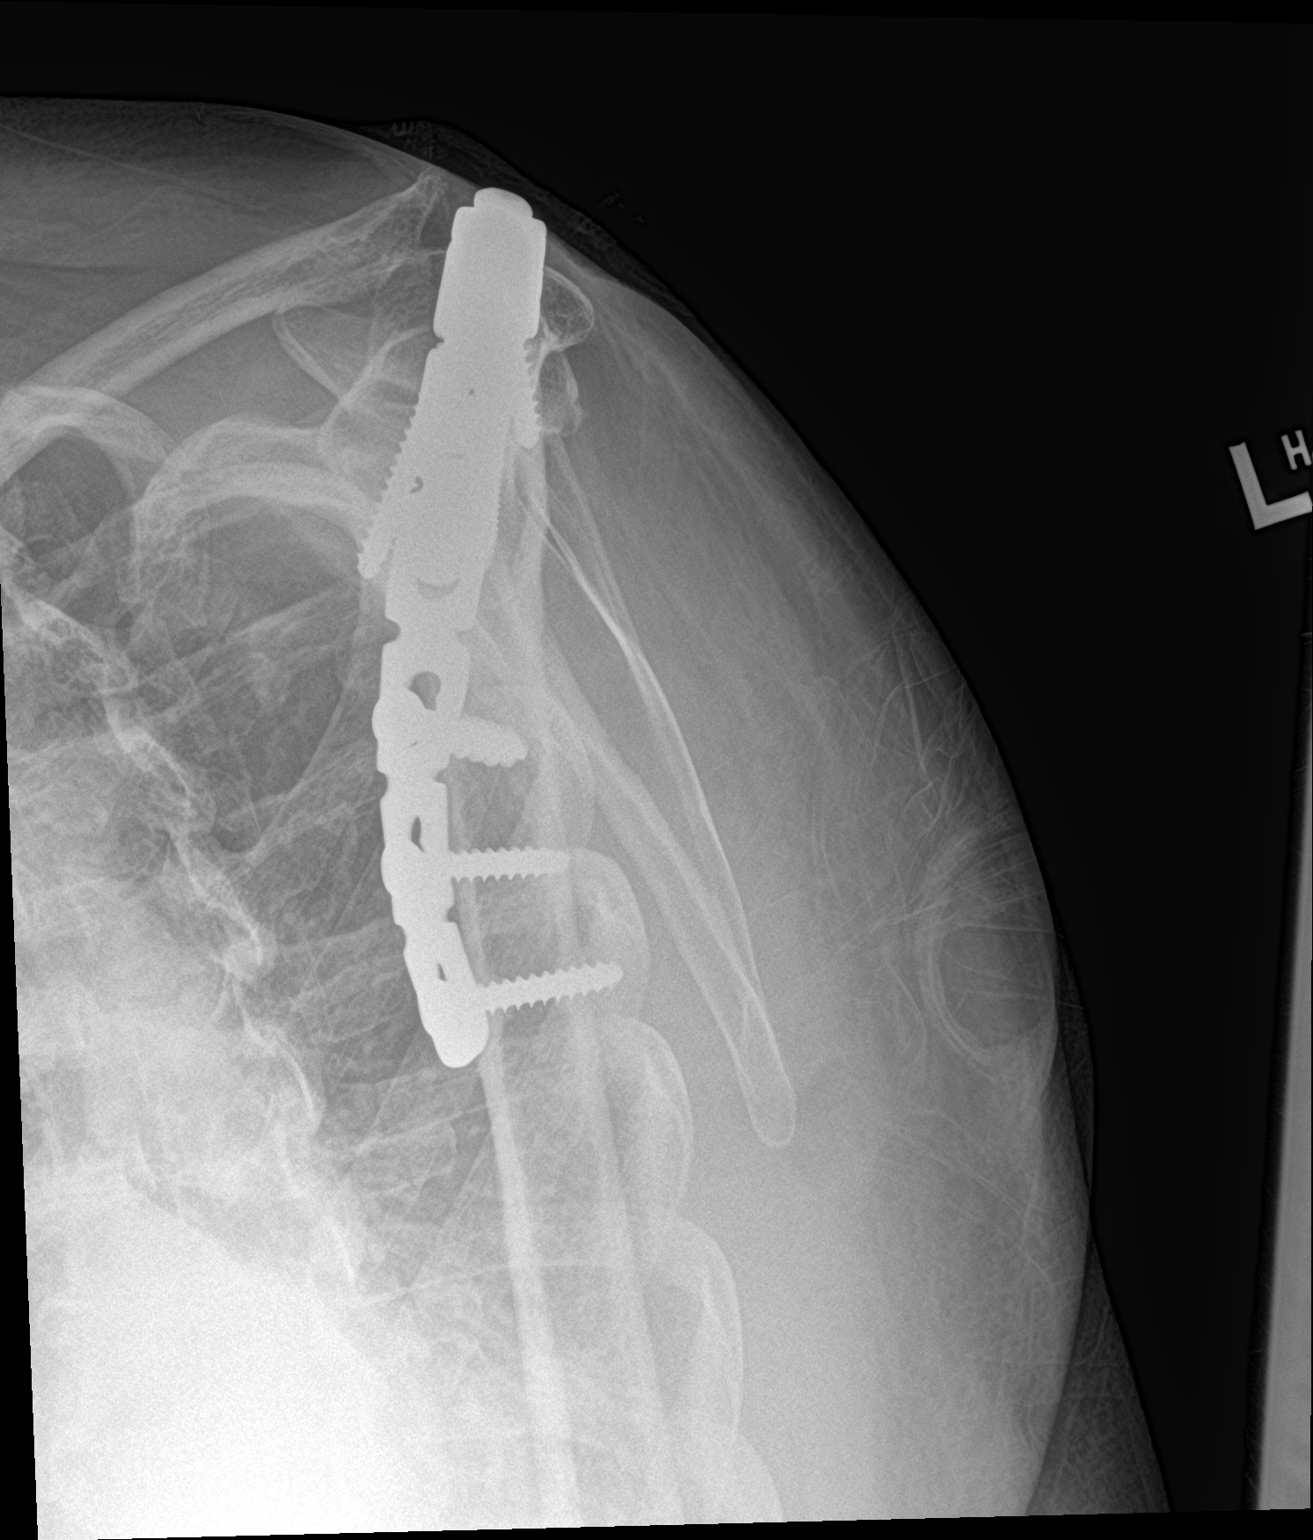

[shoulder axillary]
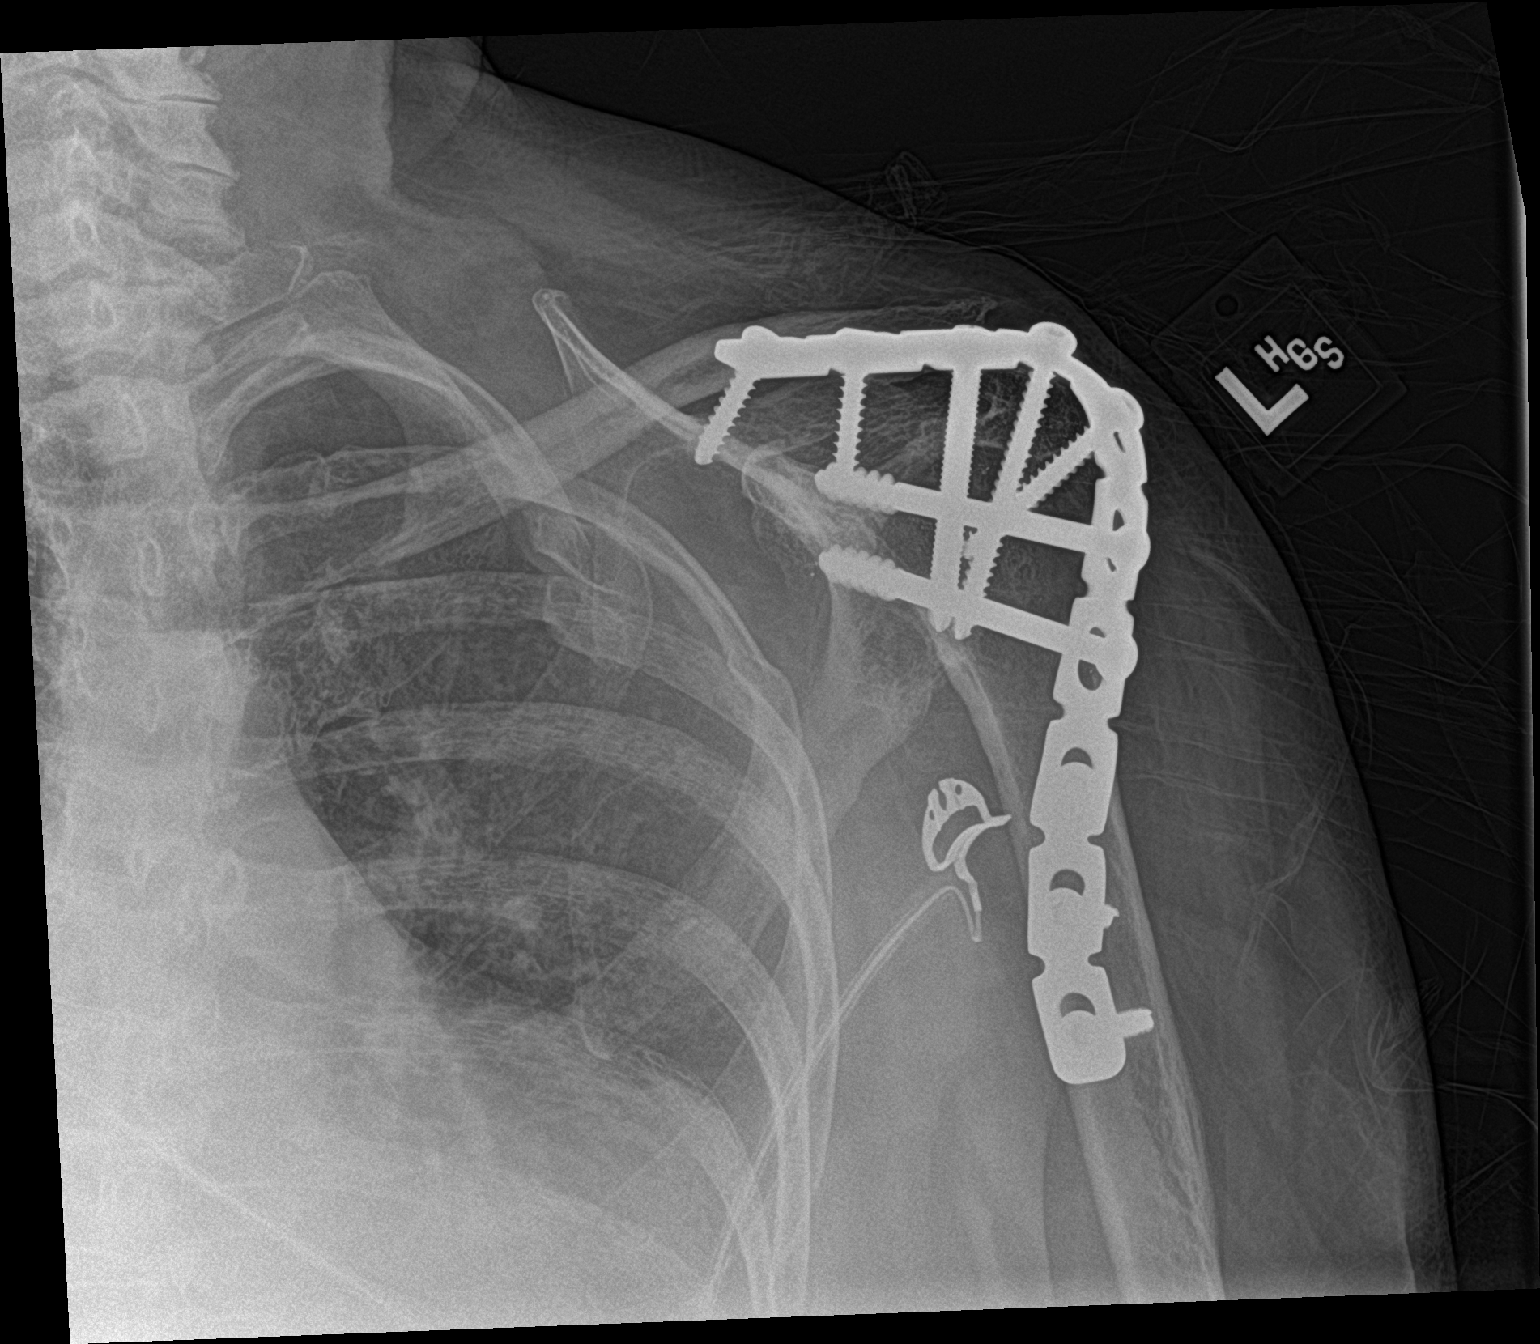

[3 of 3 positions shown; findings below may reference images not displayed]

FINDINGS: Postoperative changes to the left shoulder with a plate affixed to
the scapula and humerus by multiple screws. Surgical hardware is
intact. There appears to be fusion at the glenohumeral joint. Healed
left-sided rib fractures. No acute fracture identified.
IMPRESSION: Chronic changes in the left shoulder. There appears to be fusion at
the glenohumeral joint. No acute fracture identified.

## 2018-03-25 IMAGING — CT CT HEAD W/O CM
4 of 7 series · 15 of 47 positions shown, 16 images · non-contrast
Comparison: None.

ADDENDUM:
The report incorrectly states the patient is status post
cholecystectomy. The gallbladder is visualized and appears normal.
No cholelithiasis is noted.
CLINICAL DATA: Unwitnessed fall.  Uncertain loss of consciousness.

EXAM:
CT HEAD WITHOUT CONTRAST
CT CERVICAL SPINE WITHOUT CONTRAST
TECHNIQUE: Multidetector CT imaging of the head and cervical spine was
performed following the standard protocol without intravenous
contrast. Multiplanar CT image reconstructions of the cervical spine
were also generated.

[Series 2: head wo · axial · 0.41mm/px · z∈[-169,-79]mm · 4 of 32 slices shown, 5 images]
[im 7/32  brain]
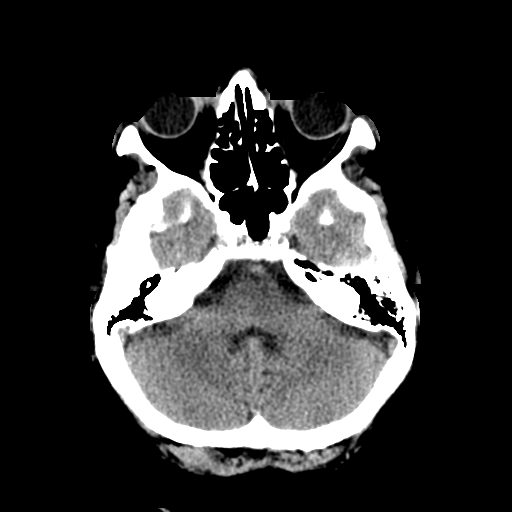
[im 7/32  bone]
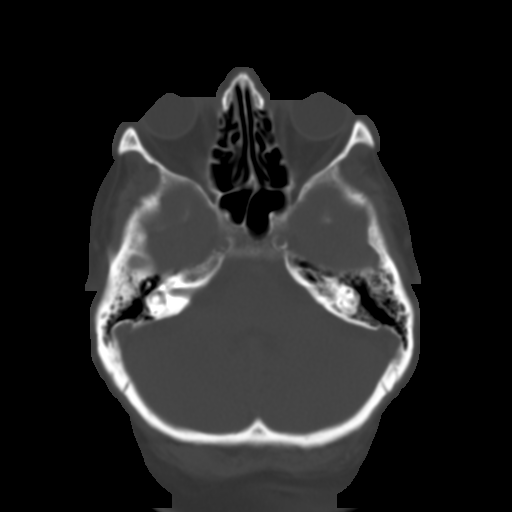
[im 13/32  brain]
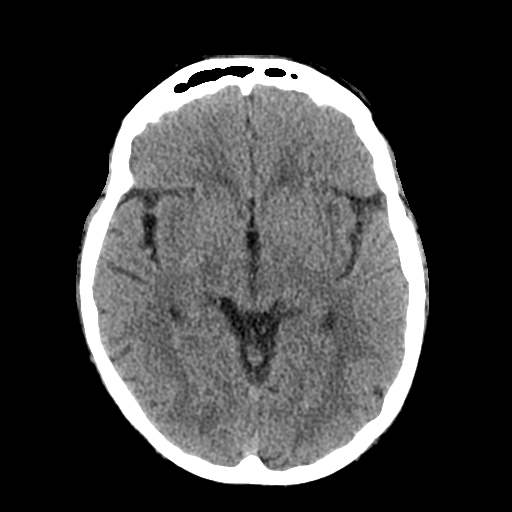
[im 19/32  brain]
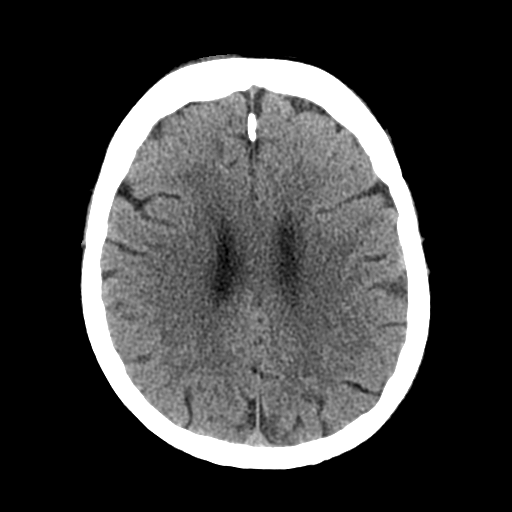
[im 25/32  brain]
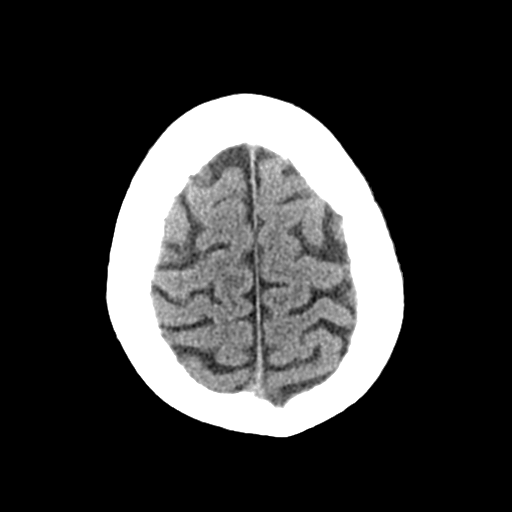

[Series 4: coronal soft tissue · coronal · 0.30mm/px · 2 of 68 slices shown]
[im 23/68  brain]
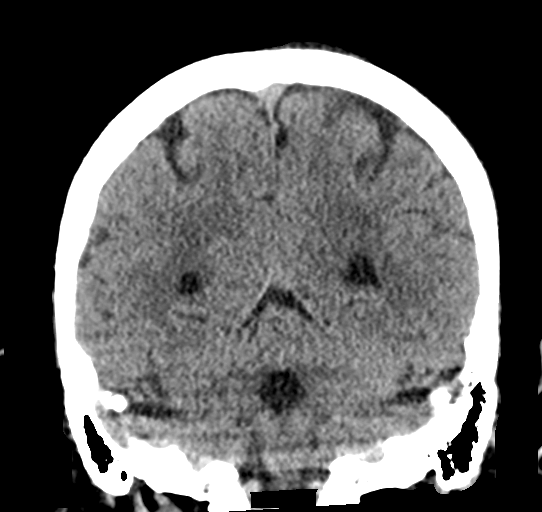
[im 45/68  brain]
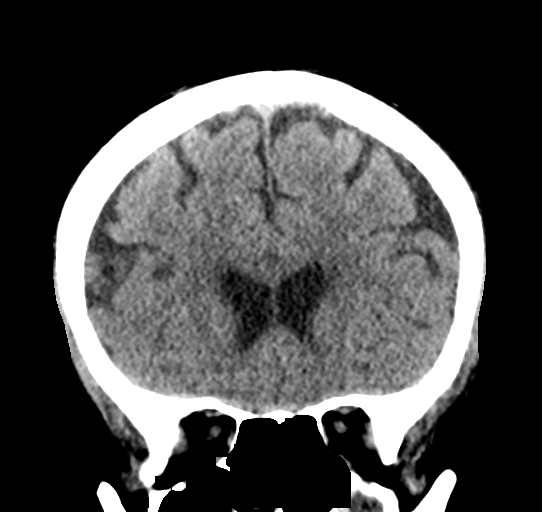

[Series 5: sagittal soft tissue · sagittal · 0.30mm/px · 1 of 57 slices shown]
[im 29/57  brain]
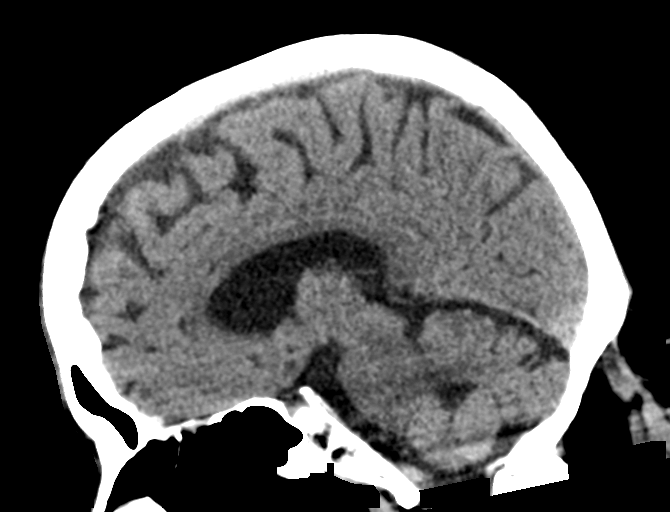

[Series 11: coronal bone · axial · 0.27mm/px · z∈[-225,-134]mm · 8 of 61 slices shown]
[im 6/61  bone]
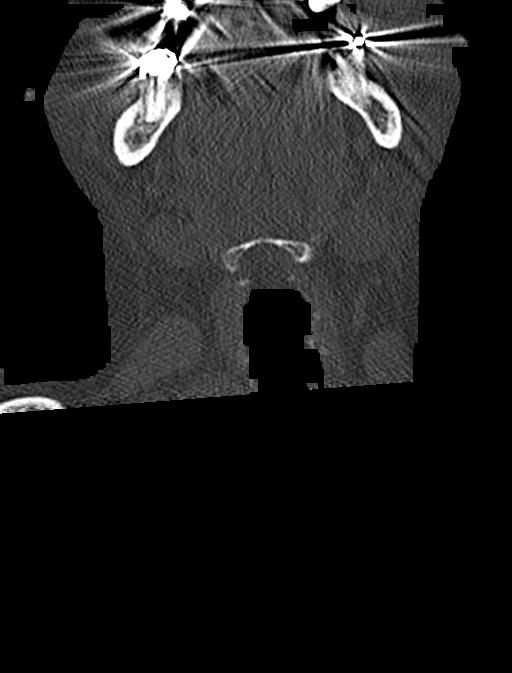
[im 16/61  bone]
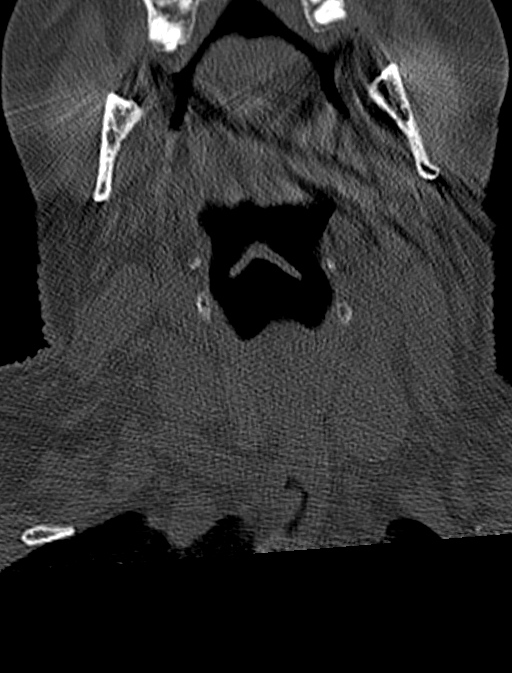
[im 21/61  bone]
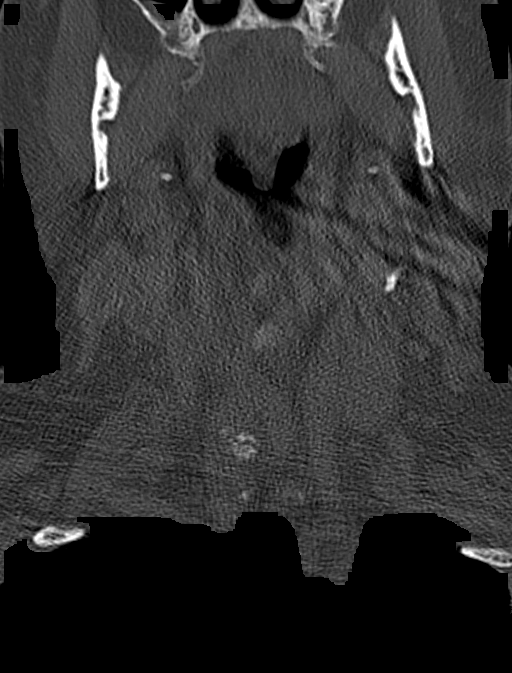
[im 26/61  bone]
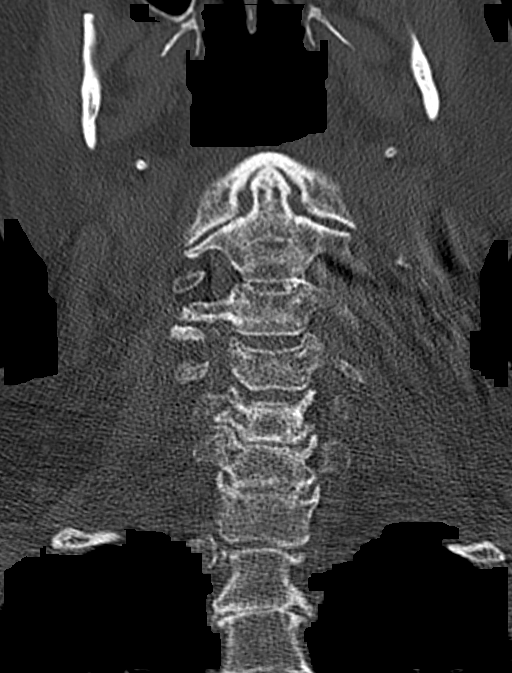
[im 36/61  bone]
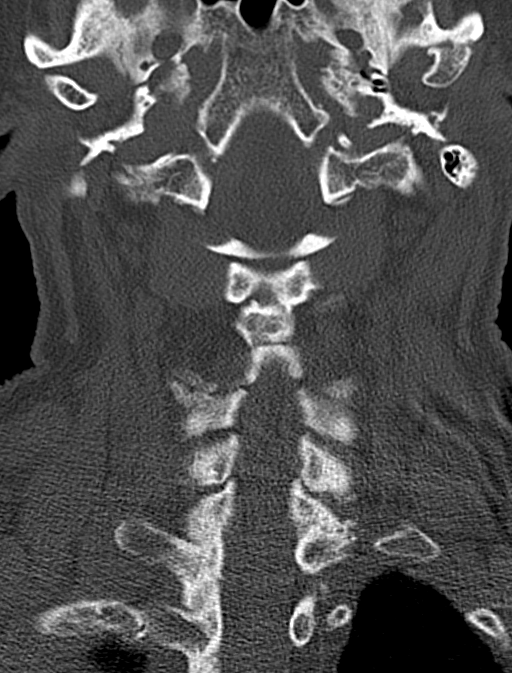
[im 41/61  bone]
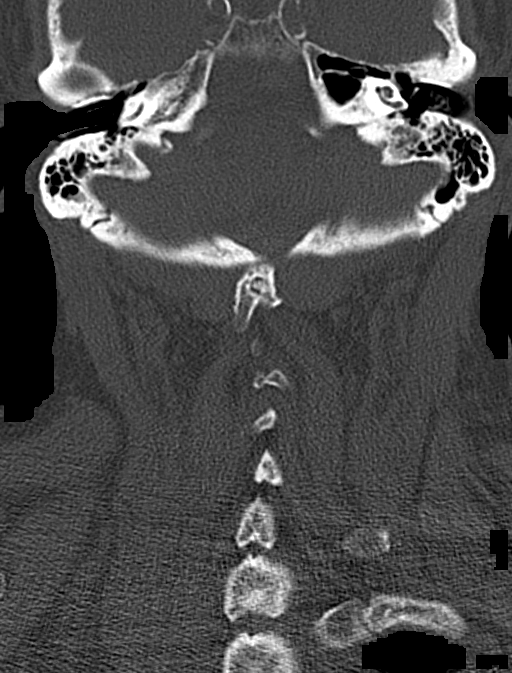
[im 46/61  bone]
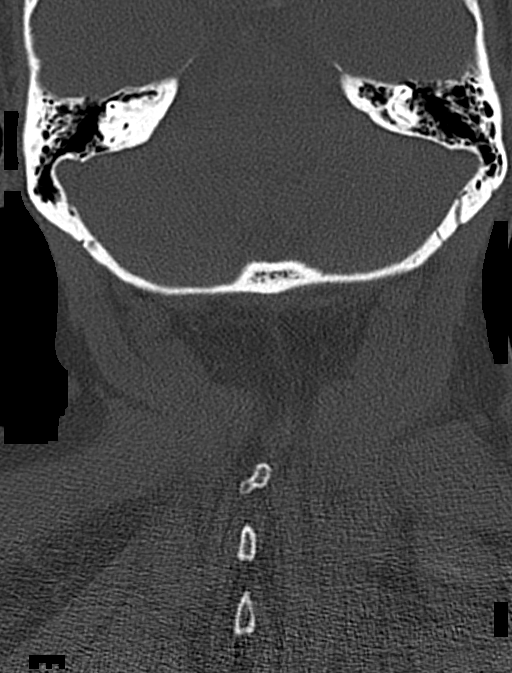
[im 56/61  bone]
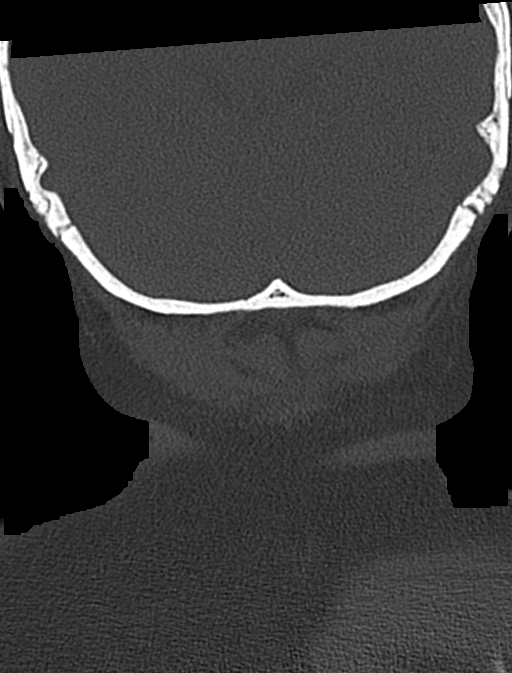

[15 of 47 positions shown; findings below may reference images not displayed]

FINDINGS: CT HEAD FINDINGS

Brain: Mild chronic ischemic white matter disease is noted. No mass
effect or midline shift is noted. Ventricular size is within normal
limits. There is no evidence of mass lesion, hemorrhage or acute
infarction.

Vascular: No hyperdense vessel or unexpected calcification.

Skull: Normal. Negative for fracture or focal lesion.

Sinuses/Orbits: Left maxillary sinusitis is noted.

Other: None.

CT CERVICAL SPINE FINDINGS

Alignment: Grade 1 anterolisthesis of C4-5 is noted secondary to
posterior facet joint hypertrophy.

Skull base and vertebrae: No acute fracture. No primary bone lesion
or focal pathologic process.

Soft tissues and spinal canal: No prevertebral fluid or swelling. No
visible canal hematoma.

Disc levels: Severe degenerative disc disease is noted at C5-6, C6-7
and C7-T1 with anterior osteophyte formation.

Upper chest: Negative.

Other: None.
IMPRESSION: Mild chronic ischemic white matter disease. No acute intracranial
abnormality seen.

Severe multilevel degenerative disc disease. No acute abnormality
seen in the cervical spine.
